# Patient Record
Sex: Male | Born: 1985 | Race: White | Hispanic: Yes | Marital: Single
Health system: Southern US, Community
[De-identification: ages and names within clinical notes are randomized; demographics above are authoritative.]

---

## 2010-03-09 ENCOUNTER — Emergency Department (HOSPITAL_COMMUNITY): Admission: EM | Admit: 2010-03-09 | Discharge: 2010-03-10 | Payer: Self-pay | Admitting: Emergency Medicine

## 2017-10-02 ENCOUNTER — Emergency Department (HOSPITAL_COMMUNITY): Payer: No Typology Code available for payment source

## 2017-10-02 ENCOUNTER — Other Ambulatory Visit: Payer: Self-pay

## 2017-10-02 ENCOUNTER — Emergency Department (HOSPITAL_COMMUNITY)
Admission: EM | Admit: 2017-10-02 | Discharge: 2017-10-02 | Disposition: A | Discharge status: Home / Self Care | Payer: No Typology Code available for payment source | Attending: Emergency Medicine | Admitting: Emergency Medicine

## 2017-10-02 ENCOUNTER — Encounter (HOSPITAL_COMMUNITY): Payer: Self-pay

## 2017-10-02 DIAGNOSIS — M545 Low back pain, unspecified: Secondary | ICD-10-CM

## 2017-10-02 DIAGNOSIS — Y999 Unspecified external cause status: Secondary | ICD-10-CM | POA: Diagnosis not present

## 2017-10-02 DIAGNOSIS — Y939 Activity, unspecified: Secondary | ICD-10-CM | POA: Diagnosis not present

## 2017-10-02 DIAGNOSIS — Y92410 Unspecified street and highway as the place of occurrence of the external cause: Secondary | ICD-10-CM | POA: Diagnosis not present

## 2017-10-02 NOTE — ED Notes (Signed)
Video interpreter used to complete triage-Inez [phone]

## 2017-10-02 NOTE — ED Notes (Signed)
Bed: WA06 Expected date:  Expected time:  Means of arrival:  Comments: 
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
## 2017-10-02 NOTE — ED Notes (Signed)
Bed: WTR5 Expected date:  Expected time:  Means of arrival:  Comments: 

## 2017-10-02 NOTE — Discharge Instructions (Addendum)
X-ray shows no acute findings.  Tylenol, ibuprofen, or Aleve for pain.  You will be sore for several days.
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
